# Patient Record
Sex: Female | Born: 1955 | Race: White | Hispanic: No | Marital: Married
Health system: Southern US, Community
[De-identification: ages and names within clinical notes are randomized; demographics above are authoritative.]

## PROBLEM LIST (undated history)

## (undated) DIAGNOSIS — I10 Essential (primary) hypertension: Secondary | ICD-10-CM

## (undated) DIAGNOSIS — E119 Type 2 diabetes mellitus without complications: Secondary | ICD-10-CM

## (undated) DIAGNOSIS — G35 Multiple sclerosis: Secondary | ICD-10-CM

---

## 2008-12-07 ENCOUNTER — Encounter (INDEPENDENT_AMBULATORY_CARE_PROVIDER_SITE_OTHER): Payer: Self-pay | Admitting: *Deleted

## 2009-07-03 ENCOUNTER — Telehealth: Payer: Self-pay | Admitting: Internal Medicine

## 2010-04-03 NOTE — Progress Notes (Signed)
Summary: Schedule EGD  Phone Note Outgoing Call Call back at [facility] Phone [phone]   Call placed by: Harlow Mares CMA Duncan Dull),  Jul 03, 2009 4:42 PM Call placed to: Patient Summary of Call: number disconnected, pt due for colonoscopy, we will mail her another letter,  Initial call taken by: Harlow Mares CMA Duncan Dull),  Jul 03, 2009 4:42 PM

## 2011-09-13 ENCOUNTER — Encounter: Payer: Self-pay | Admitting: Internal Medicine

## 2016-06-11 ENCOUNTER — Emergency Department (HOSPITAL_BASED_OUTPATIENT_CLINIC_OR_DEPARTMENT_OTHER): Payer: BLUE CROSS/BLUE SHIELD

## 2016-06-11 ENCOUNTER — Emergency Department (HOSPITAL_BASED_OUTPATIENT_CLINIC_OR_DEPARTMENT_OTHER)
Admission: EM | Admit: 2016-06-11 | Discharge: 2016-06-11 | Disposition: A | Discharge status: Home / Self Care | Payer: BLUE CROSS/BLUE SHIELD | Attending: Emergency Medicine | Admitting: Emergency Medicine

## 2016-06-11 ENCOUNTER — Encounter (HOSPITAL_BASED_OUTPATIENT_CLINIC_OR_DEPARTMENT_OTHER): Payer: Self-pay | Admitting: *Deleted

## 2016-06-11 DIAGNOSIS — I10 Essential (primary) hypertension: Secondary | ICD-10-CM | POA: Insufficient documentation

## 2016-06-11 DIAGNOSIS — R531 Weakness: Secondary | ICD-10-CM | POA: Diagnosis present

## 2016-06-11 DIAGNOSIS — Z79899 Other long term (current) drug therapy: Secondary | ICD-10-CM | POA: Diagnosis not present

## 2016-06-11 DIAGNOSIS — E860 Dehydration: Secondary | ICD-10-CM | POA: Diagnosis not present

## 2016-06-11 DIAGNOSIS — E119 Type 2 diabetes mellitus without complications: Secondary | ICD-10-CM | POA: Insufficient documentation

## 2016-06-11 HISTORY — DX: Multiple sclerosis: G35

## 2016-06-11 HISTORY — DX: Essential (primary) hypertension: I10

## 2016-06-11 HISTORY — DX: Type 2 diabetes mellitus without complications: E11.9

## 2016-06-11 LAB — CBC WITH DIFFERENTIAL/PLATELET
BASOS ABS: 0 10*3/uL (ref 0.0–0.1)
Basophils Relative: 0 %
EOS ABS: 0.1 10*3/uL (ref 0.0–0.7)
EOS PCT: 1 %
HCT: 45.3 % (ref 36.0–46.0)
Hemoglobin: 15.5 g/dL — ABNORMAL HIGH (ref 12.0–15.0)
LYMPHS PCT: 16 %
Lymphs Abs: 1.5 10*3/uL (ref 0.7–4.0)
MCH: 31.7 pg (ref 26.0–34.0)
MCHC: 34.2 g/dL (ref 30.0–36.0)
MCV: 92.6 fL (ref 78.0–100.0)
Monocytes Absolute: 0.9 10*3/uL (ref 0.1–1.0)
Monocytes Relative: 9 %
Neutro Abs: 7.1 10*3/uL (ref 1.7–7.7)
Neutrophils Relative %: 74 %
PLATELETS: 429 10*3/uL — AB (ref 150–400)
RBC: 4.89 MIL/uL (ref 3.87–5.11)
RDW: 14.5 % (ref 11.5–15.5)
WBC: 9.7 10*3/uL (ref 4.0–10.5)

## 2016-06-11 LAB — COMPREHENSIVE METABOLIC PANEL
ALT: 24 U/L (ref 14–54)
AST: 31 U/L (ref 15–41)
Albumin: 3.8 g/dL (ref 3.5–5.0)
Alkaline Phosphatase: 90 U/L (ref 38–126)
Anion gap: 11 (ref 5–15)
BILIRUBIN TOTAL: 0.2 mg/dL — AB (ref 0.3–1.2)
BUN: 17 mg/dL (ref 6–20)
CO2: 29 mmol/L (ref 22–32)
CREATININE: 1.08 mg/dL — AB (ref 0.44–1.00)
Calcium: 10 mg/dL (ref 8.9–10.3)
Chloride: 100 mmol/L — ABNORMAL LOW (ref 101–111)
GFR, EST NON AFRICAN AMERICAN: 54 mL/min — AB (ref >60)
Glucose, Bld: 108 mg/dL — ABNORMAL HIGH (ref 65–99)
POTASSIUM: 4 mmol/L (ref 3.5–5.1)
Sodium: 140 mmol/L (ref 135–145)
TOTAL PROTEIN: 7.6 g/dL (ref 6.5–8.1)

## 2016-06-11 LAB — URINALYSIS, ROUTINE W REFLEX MICROSCOPIC
Bilirubin Urine: NEGATIVE
Glucose, UA: NEGATIVE mg/dL
Hgb urine dipstick: NEGATIVE
Ketones, ur: NEGATIVE mg/dL
LEUKOCYTES UA: NEGATIVE
Nitrite: NEGATIVE
PROTEIN: NEGATIVE mg/dL
Specific Gravity, Urine: 1.017 (ref 1.005–1.030)
pH: 5.5 (ref 5.0–8.0)

## 2016-06-11 MED ORDER — SODIUM CHLORIDE 0.9 % IV BOLUS (SEPSIS)
500.0000 mL | Freq: Once | INTRAVENOUS | Status: AC
  Administered 2016-06-11: 500 mL via INTRAVENOUS

## 2016-06-11 NOTE — ED Provider Notes (Signed)
[facility] Provider Note   CSN: [id number] Arrival date & time: 06/11/16  1455     History   Chief Complaint Chief Complaint  Patient presents with  . Weakness    HPI Christina Holder is a 61 y.o. female.  HPI Patient presents with multiple complaints. Generalized weakness confusion diabetes left-sided weakness diarrhea. History of MS. States it is flaring up. She saw her primary care doctor 6 days ago. States she was diagnosed with prediabetes and started on low-dose metformin at that time. Also had been on chronic pain medicines and wean herself off. States she had worsening diarrhea. States she is weak on the left side and numb on left side. This is not unusual for her. No abdominal pain. Slight headaches. Has reportedly been missing doctor's appointments. Patient's sister think she needs to be admitted to the hospital because she has not been going for appointments.   Past Medical History:  Diagnosis Date  . Diabetes mellitus without complication (HCC)   . Hypertension   . MS (multiple sclerosis) (HCC)     There are no active problems to display for this patient.   No past surgical history on file.  OB History    No data available       Home Medications    Prior to Admission medications   Medication Sig Start Date End Date Taking? Authorizing Provider  estrogen, conjugated,-medroxyprogesterone (PREMPRO) 0.45-1.5 MG tablet Take 1 tablet by mouth daily.   Yes Historical Provider, MD  HYDROcodone-acetaminophen (NORCO/VICODIN) 5-325 MG tablet Take 1 tablet by mouth every 6 (six) hours as needed for moderate pain.   Yes Historical Provider, MD  levothyroxine (SYNTHROID, LEVOTHROID) 75 MCG tablet Take 75 mcg by mouth daily before breakfast.   Yes Historical Provider, MD  metFORMIN (GLUCOPHAGE) 500 MG tablet Take by mouth 2 (two) times daily with a meal.   Yes Historical Provider, MD  modafinil (PROVIGIL) 200 MG tablet Take 250 mg by mouth daily.   Yes  Historical Provider, MD  potassium chloride SA (K-DUR,KLOR-CON) 20 MEQ tablet Take 20 mEq by mouth 2 (two) times daily.   Yes Historical Provider, MD  zolpidem (AMBIEN) 5 MG tablet Take 5 mg by mouth at bedtime as needed for sleep.   Yes Historical Provider, MD  ALPRAZolam Prudy Feeler) 1 MG tablet 1 mg at bedtime as needed for sleep (May take 1 or 2 tabs at hs).     Historical Provider, MD  pantoprazole (PROTONIX) 40 MG tablet Take 40 mg by mouth daily.    Historical Provider, MD  triamterene-hydrochlorothiazide (MAXZIDE-25) 37.5-25 MG tablet Take by mouth daily.     Historical Provider, MD    Family History No family history on file.  Social History Social History  Substance Use Topics  . Smoking status: Not on file  . Smokeless tobacco: Not on file  . Alcohol use Not on file     Allergies   Patient has no known allergies.   Review of Systems Review of Systems  Constitutional: Negative for appetite change and unexpected weight change.  HENT: Negative for congestion.   Eyes: Negative for pain.  Respiratory: Negative for shortness of breath.   Gastrointestinal: Positive for abdominal pain and diarrhea.  Genitourinary: Positive for frequency. Negative for flank pain.  Musculoskeletal: Positive for back pain.  Neurological: Positive for weakness and numbness.  Psychiatric/Behavioral: Positive for confusion.     Physical Exam Updated Vital Signs BP 123/65 (BP Location: Right Arm)   Pulse 83  Temp 98.4 F (36.9 C) (Oral)   Resp 18   Ht  (1.651 m)   Wt 158 lb (71.7 kg)   SpO2 100%   BMI 26.29 kg/m   Physical Exam  Constitutional: She appears well-developed.  HENT:  Head: Normocephalic.  Eyes: Pupils are equal, round, and reactive to light.  Neck: Neck supple.  Cardiovascular: Normal rate.   Pulmonary/Chest: Effort normal.  Abdominal: There is no tenderness.  Neurological: She is alert.  Mildly decreased sensation to right hand. Good grip strength bilaterally  although maybe a little decreased on left side compared to right. Patient states is chronic. Face symmetric. Patient is awake and appropriate.  Skin: Skin is warm. Capillary refill takes less than 2 seconds.  Psychiatric: She has a normal mood and affect.     ED Treatments / Results  Labs (all labs ordered are listed, but only abnormal results are displayed) Labs Reviewed  COMPREHENSIVE METABOLIC PANEL - Abnormal; Notable for the following:       Result Value   Chloride 100 (*)    Glucose, Bld 108 (*)    Creatinine, Ser 1.08 (*)    Total Bilirubin 0.2 (*)    GFR calc non Af Amer 54 (*)    All other components within normal limits  CBC WITH DIFFERENTIAL/PLATELET - Abnormal; Notable for the following:    Hemoglobin 15.5 (*)    Platelets 429 (*)    All other components within normal limits  URINALYSIS, ROUTINE W REFLEX MICROSCOPIC - Abnormal; Notable for the following:    APPearance CLOUDY (*)    All other components within normal limits    EKG  EKG Interpretation  Date/Time:  Tuesday June 11 2016 15:35:23 EDT Ventricular Rate:  100 PR Interval:    QRS Duration: 85 QT Interval:  345 QTC Calculation: 445 R Axis:   21 Text Interpretation:  Sinus tachycardia Ventricular premature complex Aberrant complex Confirmed by Rubin Payor  MD, Starnisha Batrez [phone]) on 06/11/2016 4:15:06 PM       Radiology Dg Chest 2 View  Result Date: 06/11/2016 CLINICAL DATA:  Multiple complaints. History of MS, diabetes, hypertension, intracranial aneurysms. EXAM: CHEST  2 VIEW COMPARISON:  None in PACs FINDINGS: The lungs are well-expanded. There is no focal infiltrate. The interstitial markings are coarse bilaterally. There is no pleural effusion. The heart and pulmonary vascularity are normal. The mediastinum is normal in width. There is calcification in the wall of the aortic arch. There is mild multilevel degenerative disc disease of the thoracic spine. IMPRESSION: Chronic bronchitic changes. No pneumonia,  CHF, nor other acute cardiopulmonary abnormality. Electronically Signed   By: David  [place] M.D.   On: 06/11/2016 16:58   Ct Head Wo Contrast  Result Date: 06/11/2016 CLINICAL DATA:  Multiple sclerosis, LEFT side weakness, confusion, diabetes mellitus, hypertension, history of a brain aneurysm EXAM: CT HEAD WITHOUT CONTRAST TECHNIQUE: Contiguous axial images were obtained from the base of the skull through the vertex without intravenous contrast. COMPARISON:  None FINDINGS: Brain: Beam hardening artifacts from an aneurysm clip at the expected position of the anterior communicating artery. Generalized atrophy. Normal ventricular morphology. No midline shift or mass effect. Small vessel chronic ischemic changes of deep cerebral white matter. Question old infarct at the anterior aspect of the RIGHT temporal lobe. No intracranial hemorrhage, mass lesion, evidence of acute infarction, or extra-axial fluid collection. Vascular: Atherosclerotic calcifications of the internal carotid arteries at the skullbase. Skull: Prior RIGHT frontotemporal craniotomy Sinuses/Orbits: Clear Other: N/A IMPRESSION: Prior aneurysm  clipping at the expected position of the anterior communicating artery. Atrophy with small vessel chronic ischemic changes of deep cerebral white matter. Question old infarct at the anterior RIGHT temporal lobe. No acute intracranial abnormalities. Electronically Signed   By: Ulyses Southward M.D.   On: 06/11/2016 16:57    Procedures Procedures (including critical care time)  Medications Ordered in ED Medications  sodium chloride 0.9 % bolus 500 mL (0 mLs Intravenous Stopped 06/11/16 1715)     Initial Impression / Assessment and Plan / ED Course  I have reviewed the triage vital signs and the nursing notes.  Pertinent labs & imaging results that were available during my care of the patient were reviewed by me and considered in my medical decision making (see chart for details).     Patient with  multiple complaints. Generalized weakness left-sided weakness myalgias confusion. Recently saw primary care doctor. Has some dehydration on labs otherwise labs reassuring. Feels better after IV fluids. Do not see reason for admission this time. Discharge home to follow-up with PCP.  Final Clinical Impressions(s) / ED Diagnoses   Final diagnoses:  Generalized weakness  Dehydration    New Prescriptions Discharge Medication List as of 06/11/2016  5:54 PM       Benjiman Core, MD 06/11/16 2322

## 2016-06-11 NOTE — ED Triage Notes (Signed)
States she weaned herself off pain medications in January and her doctors are upset with her. She goes to pain management. her pain medications were reduced. She states she is being punished for not going for her regular appointments. States she saw her doctor last week and was started on new medication for diabetes. Her daughter states she does not get consistent care and feels she needs to be admitted to see what is wrong with her. She has multiple complaints.

## 2017-09-20 IMAGING — DX DG CHEST 2V
2 series · 2 of 2 positions shown · non-contrast
Comparison: None in PACs

CLINICAL DATA: Multiple complaints. History of MS, diabetes,
hypertension, intracranial aneurysms.

EXAM:
CHEST  2 VIEW

[chest lat]
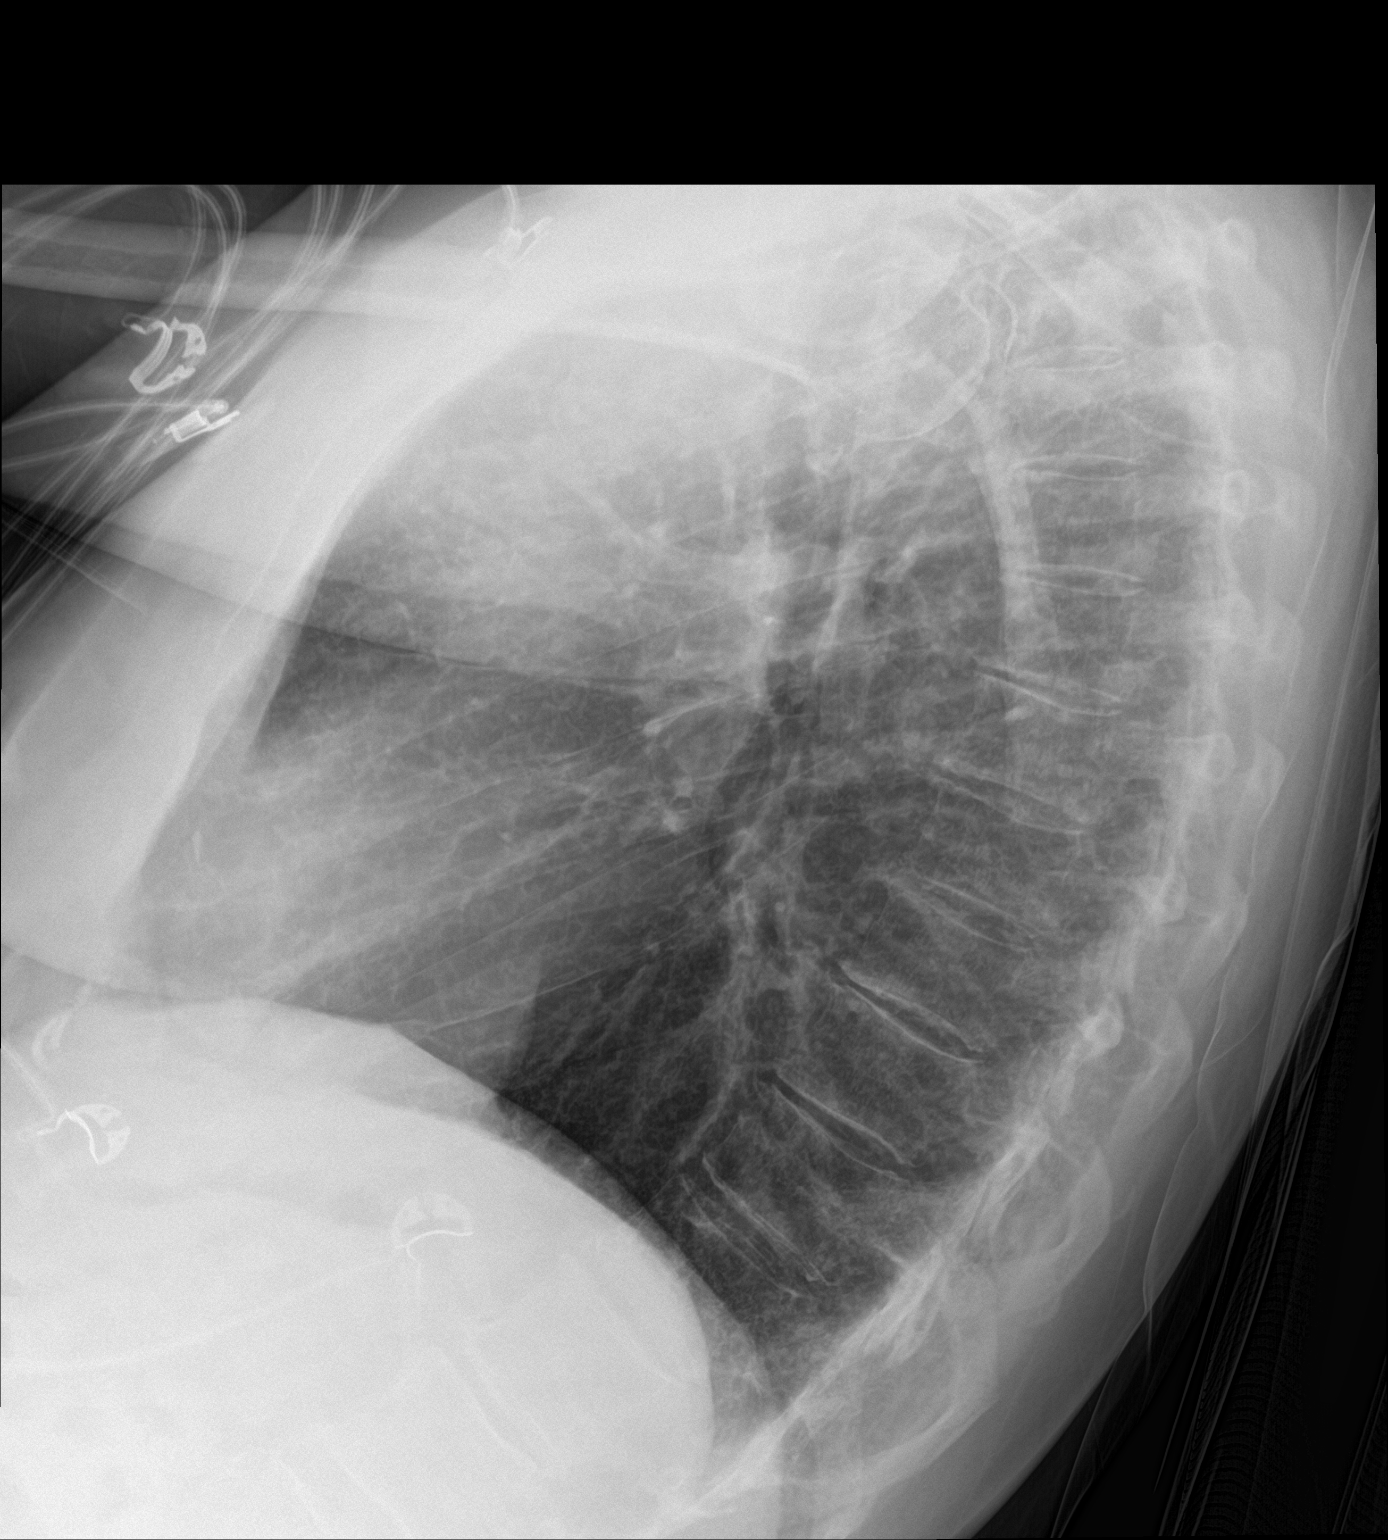

[chest ap]
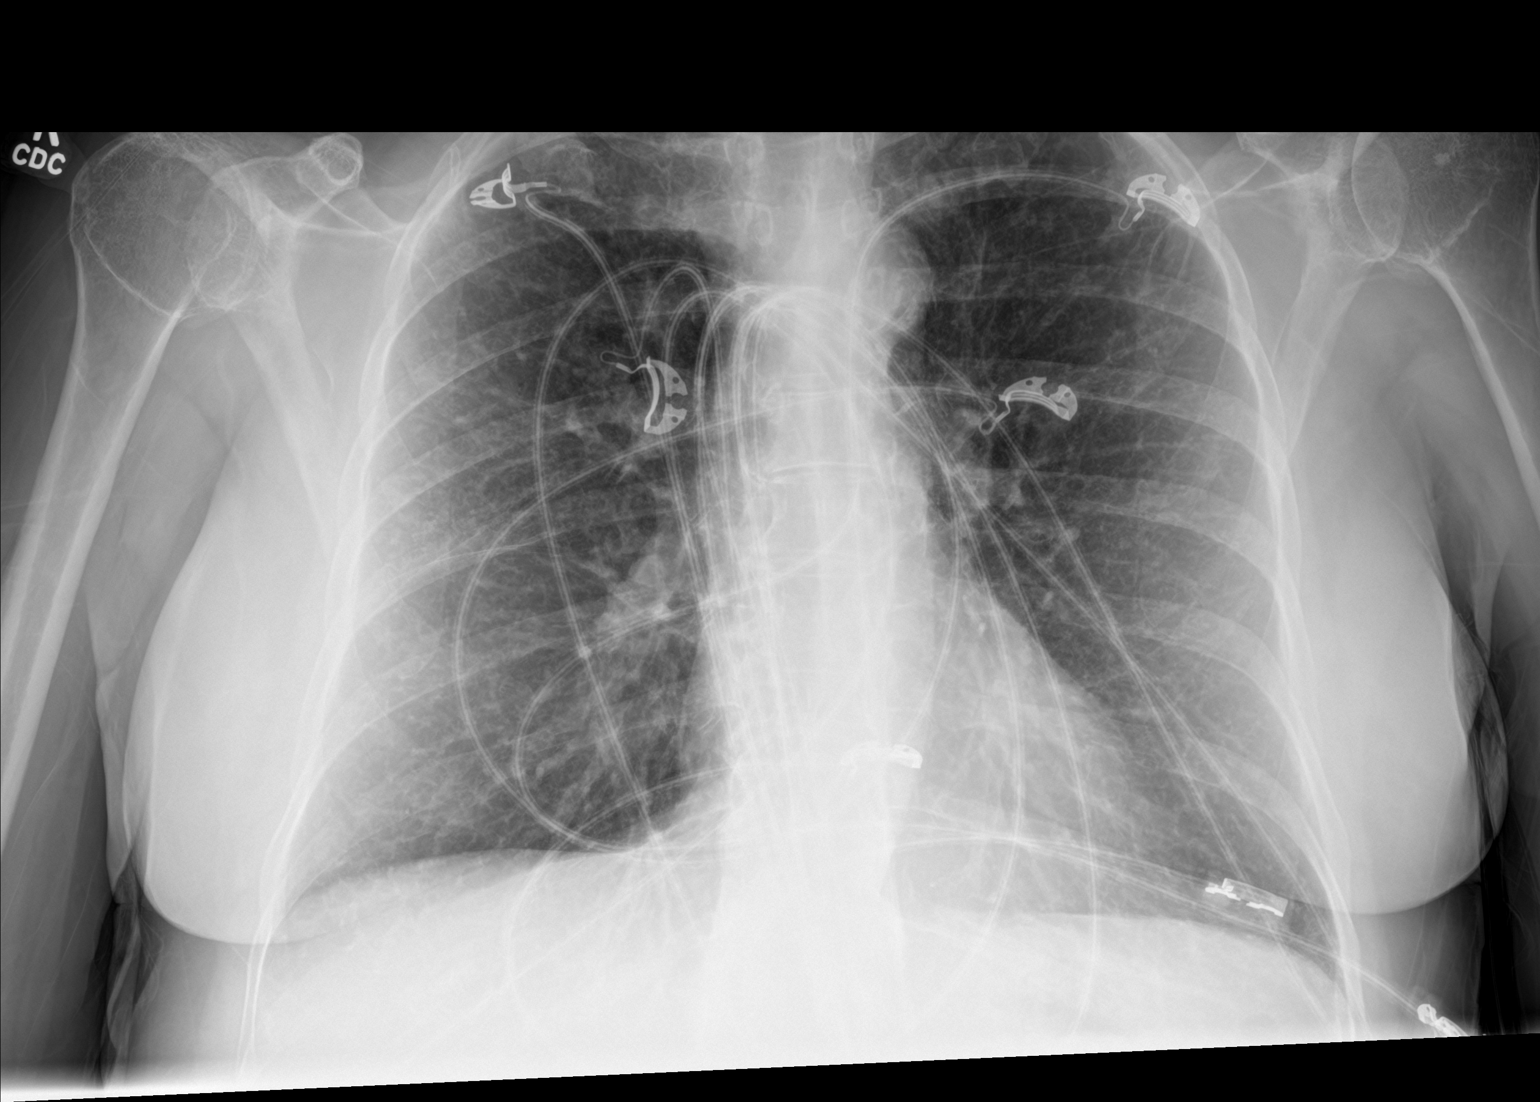

[2 of 2 positions shown; findings below may reference images not displayed]

FINDINGS: The lungs are well-expanded. There is no focal infiltrate. The
interstitial markings are coarse bilaterally. There is no pleural
effusion. The heart and pulmonary vascularity are normal. The
mediastinum is normal in width. There is calcification in the wall
of the aortic arch. There is mild multilevel degenerative disc
disease of the thoracic spine.
IMPRESSION: Chronic bronchitic changes. No pneumonia, CHF, nor other acute
cardiopulmonary abnormality.

## 2017-09-20 IMAGING — CT CT HEAD W/O CM
3 series · 14 of 47 positions shown, 16 images · non-contrast
Comparison: None

CLINICAL DATA: Multiple sclerosis, LEFT side weakness, confusion,
diabetes mellitus, hypertension, history of a brain aneurysm

EXAM:
CT HEAD WITHOUT CONTRAST
TECHNIQUE: Contiguous axial images were obtained from the base of the skull
through the vertex without intravenous contrast.

[Series 2: head wo · axial · 0.42mm/px · z∈[-177,-52]mm · 8 of 31 slices shown, 10 images]
[im 3/31  brain]
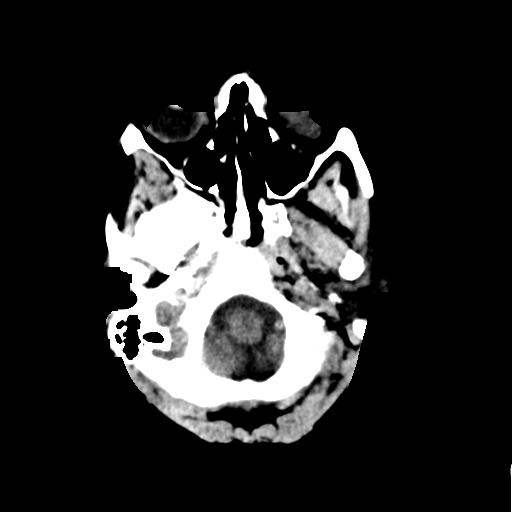
[im 3/31  bone]
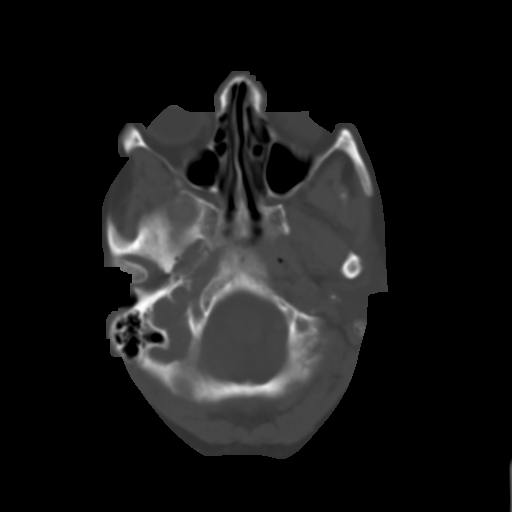
[im 7/31  brain]
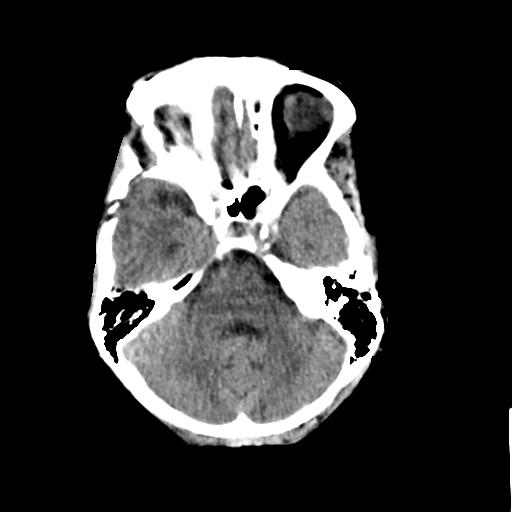
[im 10/31  brain]
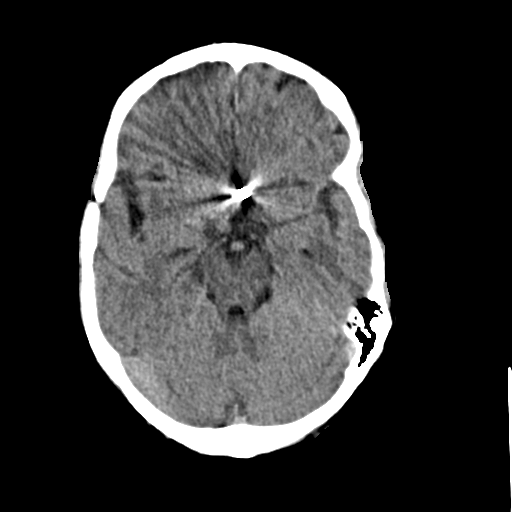
[im 14/31  brain]
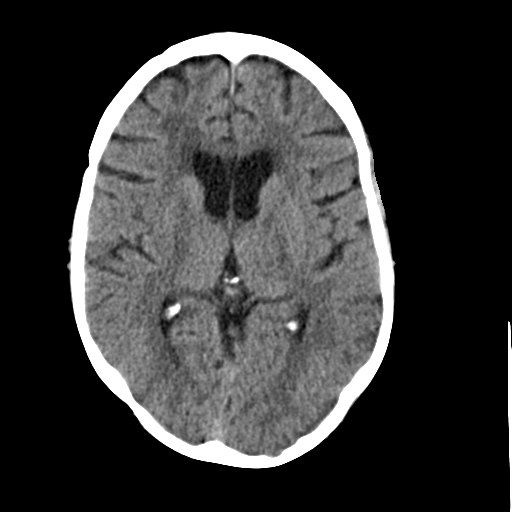
[im 17/31  brain]
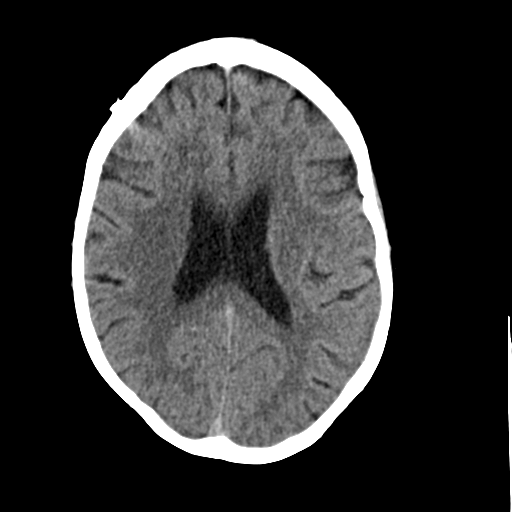
[im 17/31  bone]
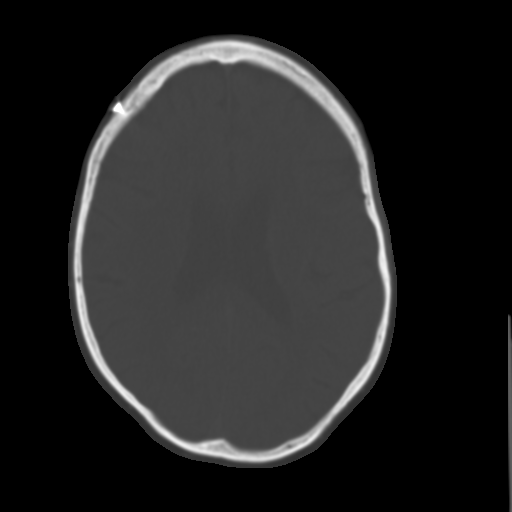
[im 21/31  brain]
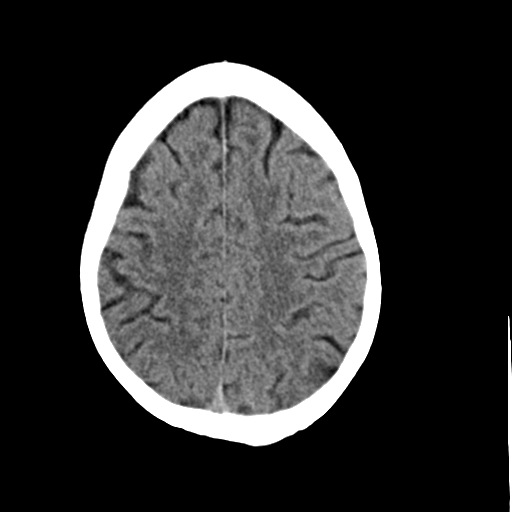
[im 24/31  brain]
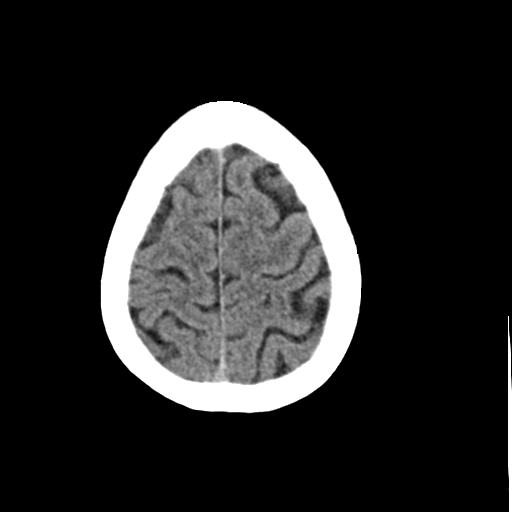
[im 28/31  brain]
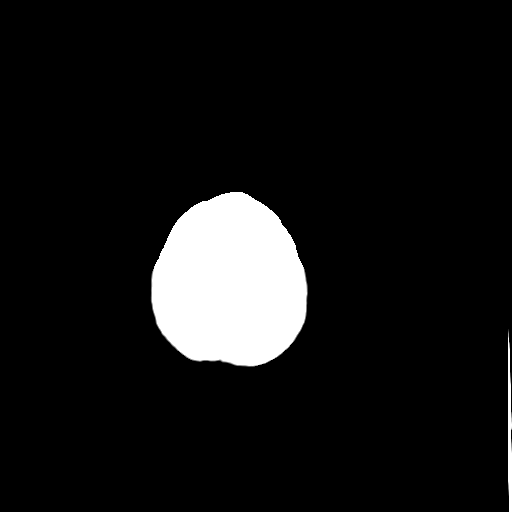

[Series 4: coronal soft · coronal · 0.31mm/px · 3 of 67 slices shown]
[im 23/67  brain]
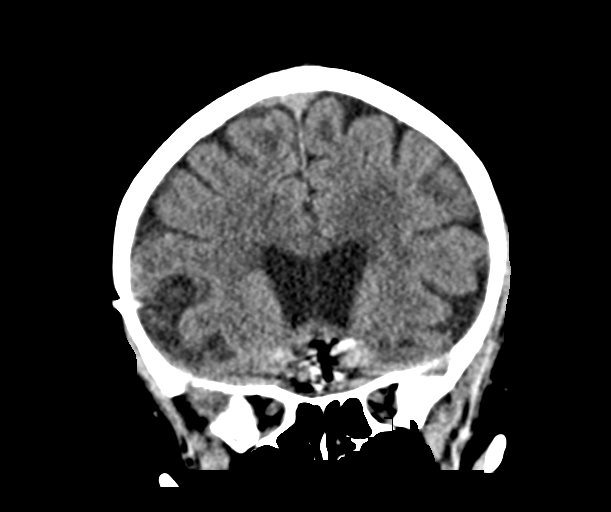
[im 30/67  brain]
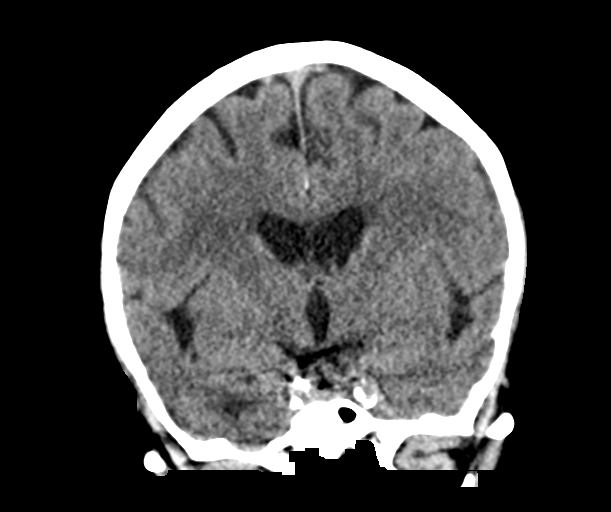
[im 37/67  brain]
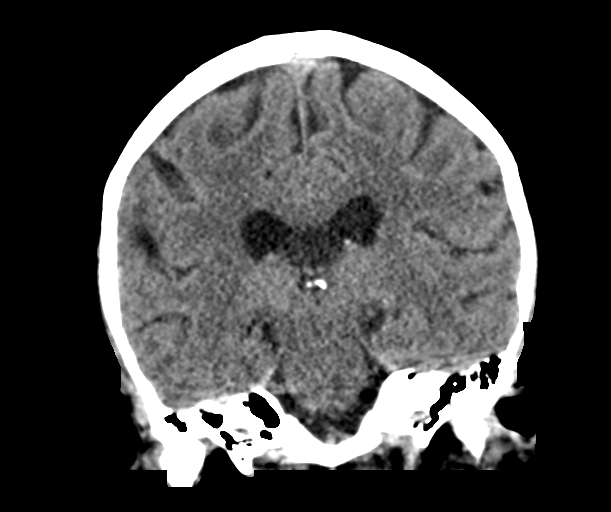

[Series 5: sag soft · sagittal · 0.30mm/px · 3 of 52 slices shown]
[im 18/52  brain]
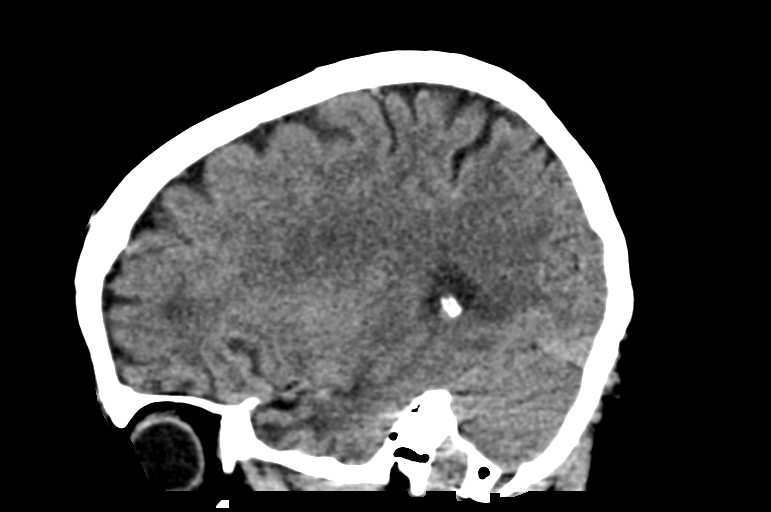
[im 26/52  brain]
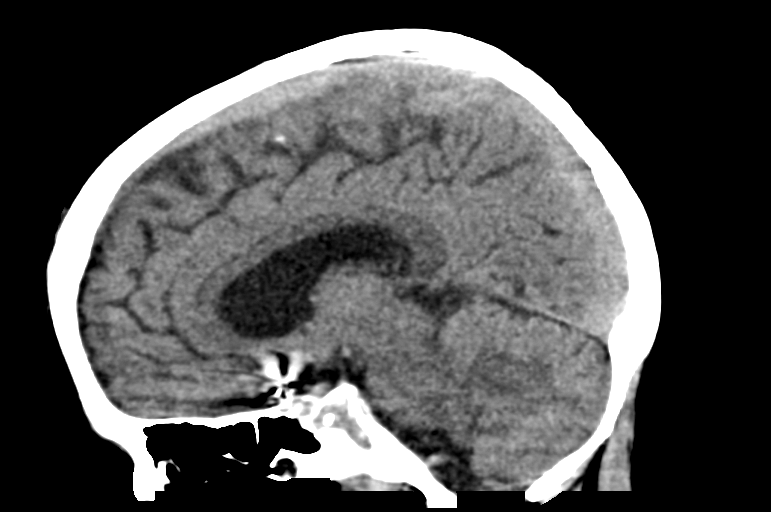
[im 35/52  brain]
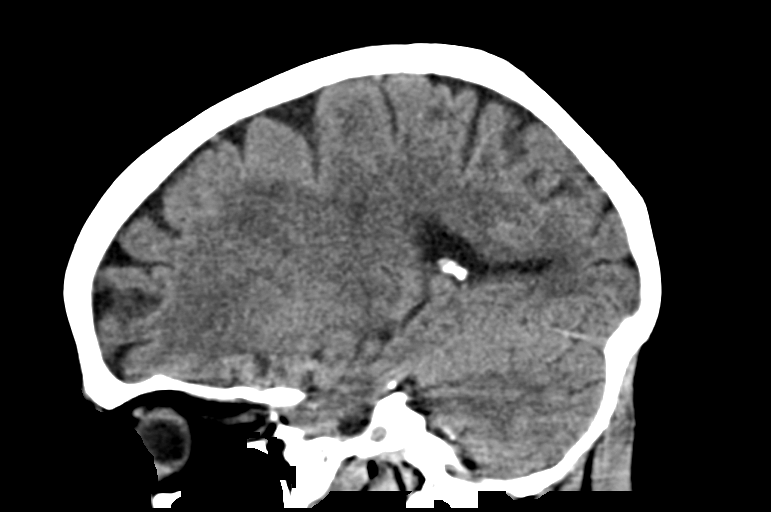

[14 of 47 positions shown; findings below may reference images not displayed]

FINDINGS: Brain: Beam hardening artifacts from an aneurysm clip at the
expected position of the anterior communicating artery. Generalized
atrophy. Normal ventricular morphology. No midline shift or mass
effect. Small vessel chronic ischemic changes of deep cerebral white
matter. Question old infarct at the anterior aspect of the RIGHT
temporal lobe. No intracranial hemorrhage, mass lesion, evidence of
acute infarction, or extra-axial fluid collection.

Vascular: Atherosclerotic calcifications of the internal carotid
arteries at the skullbase.

Skull: Prior RIGHT frontotemporal craniotomy

Sinuses/Orbits: Clear

Other: N/A
IMPRESSION: Prior aneurysm clipping at the expected position of the anterior
communicating artery.

Atrophy with small vessel chronic ischemic changes of deep cerebral
white matter.

Question old infarct at the anterior RIGHT temporal lobe.

No acute intracranial abnormalities.

## 2022-01-02 DEATH — deceased
